# Patient Record
Sex: Male | Born: 1990 | Race: Asian | Hispanic: No | Marital: Married | Smoking: Never smoker
Health system: Southern US, Community
[De-identification: ages and names within clinical notes are randomized; demographics above are authoritative.]

---

## 2015-10-23 ENCOUNTER — Encounter (HOSPITAL_COMMUNITY): Payer: Self-pay

## 2015-10-23 ENCOUNTER — Emergency Department (HOSPITAL_COMMUNITY): Payer: Medicaid Other

## 2015-10-23 ENCOUNTER — Emergency Department (HOSPITAL_COMMUNITY)
Admission: EM | Admit: 2015-10-23 | Discharge: 2015-10-23 | Disposition: A | Discharge status: Home / Self Care | Payer: Medicaid Other | Attending: Emergency Medicine | Admitting: Emergency Medicine

## 2015-10-23 DIAGNOSIS — M79605 Pain in left leg: Secondary | ICD-10-CM

## 2015-10-23 NOTE — ED Notes (Signed)
Pt came to [facility] requesting work note.  Ok to give per [employer] Energy[employer] PA

## 2015-10-23 NOTE — ED Notes (Signed)
Per Pt, Pt had an injury to the left leg a couple years ago. Pt reports injury pain has increased lately, and he is requesting an Xray for the leg.

## 2015-10-23 NOTE — ED Provider Notes (Signed)
CSN: [id number]     Arrival date & time 10/23/15  1412 History  By signing my name below, I, Ronney LionSuzanne Le, attest that this documentation has been prepared under the direction and in the presence of Newell RubbermaidJeffrey Aylani Spurlock, PA-C. Electronically Signed: Ronney LionSuzanne Le, ED Scribe. 10/23/2015. 3:43 PM.    Chief Complaint  Patient presents with  . Leg Pain   The history is provided by the patient. A language interpreter was used (tele-interpreter (language: Clydie BraunKaren)).   HPI Comments: Phone interpreter used Drue StagerMoe Myint Ezekiel Inaung is a 25 y.o. male who presents to the Emergency Department complaining of 7/10, aching left leg pain that began 1 year ago after stepping on a rock while playing an athletic sport. He also complains of an associated constant, tingling sensation that begins in his left heel and extends to his groin. Patient states he is able to walk, but doing so causes increased pain. Patient denies any back pain or weakness.  History reviewed. No pertinent past medical history. History reviewed. No pertinent past surgical history. No family history on file. Social History  Substance Use Topics  . Smoking status: Never Smoker   . Smokeless tobacco: None  . Alcohol Use: Yes     Comment: Occasional    Review of Systems  Musculoskeletal: Positive for myalgias (left leg pain). Negative for back pain.  Neurological: Negative for weakness.       Positive for left leg paresthesia.    Allergies  Review of patient's allergies indicates no known allergies.  Home Medications   Prior to Admission medications   Not on File   BP 118/70 mmHg  Pulse 68  Temp(Src) 97.3 F (36.3 C) (Oral)  Resp 16  SpO2 100%   Physical Exam  Constitutional: He is oriented to person, place, and time. He appears well-developed and well-nourished. No distress.  HENT:  Head: Normocephalic and atraumatic.  Eyes: Conjunctivae and EOM are normal.  Neck: Neck supple. No tracheal deviation present.  Cardiovascular: Normal rate.    Pulmonary/Chest: Effort normal. No respiratory distress.  Musculoskeletal: Normal range of motion.  Patient has no point tenderness to palpation, sensation intact to bilateral lower extremities, patellar reflexes 2+  Patient has no CT or L-spine tenderness to palpation full active range of motion of the knee hip and ankle.  Neurological: He is alert and oriented to person, place, and time.  Skin: Skin is warm and dry.  Psychiatric: He has a normal mood and affect. His behavior is normal.  Nursing note and vitals reviewed.   ED Course  Procedures (including critical care time)  DIAGNOSTIC STUDIES: Oxygen Saturation is 99% on RA, normal by my interpretation.    COORDINATION OF CARE: 3:31 PM - Discussed treatment plan with pt at bedside which includes left ankle and left lower leg x-ray. Pt verbalized understanding and agreed to plan.   Imaging Review Dg Tibia/fibula Left  10/23/2015  CLINICAL DATA:  Left fibular pain for 3 weeks. EXAM: LEFT TIBIA AND FIBULA - 2 VIEW COMPARISON:  None. FINDINGS: There is no evidence of fracture or other focal bone lesions. Soft tissues are unremarkable. IMPRESSION: Negative. Electronically Signed   By: Marnee SpringJonathon  Watts M.D.   On: 10/23/2015 16:25   Dg Ankle Complete Left  10/23/2015  CLINICAL DATA:  Pain for 3 weeks.  Injury 1 year prior. EXAM: LEFT ANKLE COMPLETE - 3+ VIEW COMPARISON:  None. FINDINGS: Frontal, oblique, and lateral views were obtained. There is no demonstrable fracture or joint effusion. Ankle mortise appears intact.  No appreciable arthropathic change. No erosion. IMPRESSION: No fracture or appreciable arthropathy. Ankle mortise appears intact. Electronically Signed   By: Bretta BangWilliam  Woodruff III M.D.   On: 10/23/2015 16:30   I have personally reviewed and evaluated these images as part of my medical decision-making.  MDM   Final diagnoses:  Pain of left lower extremity   Labs:  Imaging: DG Ankle Left and DG Tibia/Fibula  Left  Consults:  Therapeutics:  Discharge Meds:   Assessment/Plan:  Patient presents with reported tingling and pain in his left lower extremity. This is been present for approximately year. Patient is a very difficult exam as he does not speak [place], interpreter used, but still somewhat of a barrier between interpretation. Uncertain if this is patient difficulty explaining. Patient's symptoms started after an injury, there are diffuse to the left lower extremity, he has no loss of strength, reflexes intact. Patient has no lower back pain, point tenderness on exam. Patient will be referred to orthopedics for further evaluation and management. I have very low suspicion for acute neurological deficit that would require immediate evaluation here in the ED.  I personally performed the services described in this documentation, which was scribed in my presence. The recorded information has been reviewed and is accurate.    Eyvonne MechanicJeffrey Willine Schwalbe, PA-C 10/23/15 1706  Doug SouSam Jacubowitz, MD 10/23/15 [phone]

## 2015-10-23 NOTE — ED Notes (Signed)
Pt's family member reports family does not know how to see a doctor without coming into the ED.  This RN discussed with pt and family the role of a PCP and how referrals work.  This RN made referral to case management and provided pt [facility] and also [facility].  Pt and family member verbalized understanding.

## 2015-10-23 NOTE — ED Notes (Signed)
[place] is from [place], here for 6 months. C/o left lower leg pain since falling when he was "a kid". Info gathered through [place] speaking family member.

## 2015-10-23 NOTE — Discharge Instructions (Signed)
Please follow-up with Dr. Eulah PontMurphy for further evaluation and management of your ongoing complaints. If new or worsening signs or symptoms present please return medially for further evaluation Musculoskeletal Pain Musculoskeletal pain is muscle and boney aches and pains. These pains can occur in any part of the body. Your caregiver may treat you without knowing the cause of the pain. They may treat you if blood or urine tests, X-rays, and other tests were normal.  CAUSES There is often not a definite cause or reason for these pains. These pains may be caused by a type of germ (virus). The discomfort may also come from overuse. Overuse includes working out too hard when your body is not fit. Boney aches also come from weather changes. Bone is sensitive to atmospheric pressure changes. HOME CARE INSTRUCTIONS   Ask when your test results will be ready. Make sure you get your test results.  Only take over-the-counter or prescription medicines for pain, discomfort, or fever as directed by your caregiver. If you were given medications for your condition, do not drive, operate machinery or power tools, or sign legal documents for 24 hours. Do not drink alcohol. Do not take sleeping pills or other medications that may interfere with treatment.  Continue all activities unless the activities cause more pain. When the pain lessens, slowly resume normal activities. Gradually increase the intensity and duration of the activities or exercise.  During periods of severe pain, bed rest may be helpful. Lay or sit in any position that is comfortable.  Putting ice on the injured area.  Put ice in a bag.  Place a towel between your skin and the bag.  Leave the ice on for 15 to 20 minutes, 3 to 4 times a day.  Follow up with your caregiver for continued problems and no reason can be found for the pain. If the pain becomes worse or does not go away, it may be necessary to repeat tests or do additional testing. Your  caregiver may need to look further for a possible cause. SEEK IMMEDIATE MEDICAL CARE IF:  You have pain that is getting worse and is not relieved by medications.  You develop chest pain that is associated with shortness or breath, sweating, feeling sick to your stomach (nauseous), or throw up (vomit).  Your pain becomes localized to the abdomen.  You develop any new symptoms that seem different or that concern you. MAKE SURE YOU:   Understand these instructions.  Will watch your condition.  Will get help right away if you are not doing well or get worse.   This information is not intended to replace advice given to you by your health care provider. Make sure you discuss any questions you have with your health care provider.   Document Released: 04/14/2005 Document Revised: 07/07/2011 Document Reviewed: 12/17/2012 Elsevier Interactive Patient Education [employer] Inc.

## 2017-12-16 IMAGING — DX DG TIBIA/FIBULA 2V*L*
4 series · 4 of 4 positions shown · non-contrast
Comparison: None.

CLINICAL DATA: Left fibular pain for 3 weeks.

EXAM:
LEFT TIBIA AND FIBULA - 2 VIEW

[tibia ap (1 of 2)]
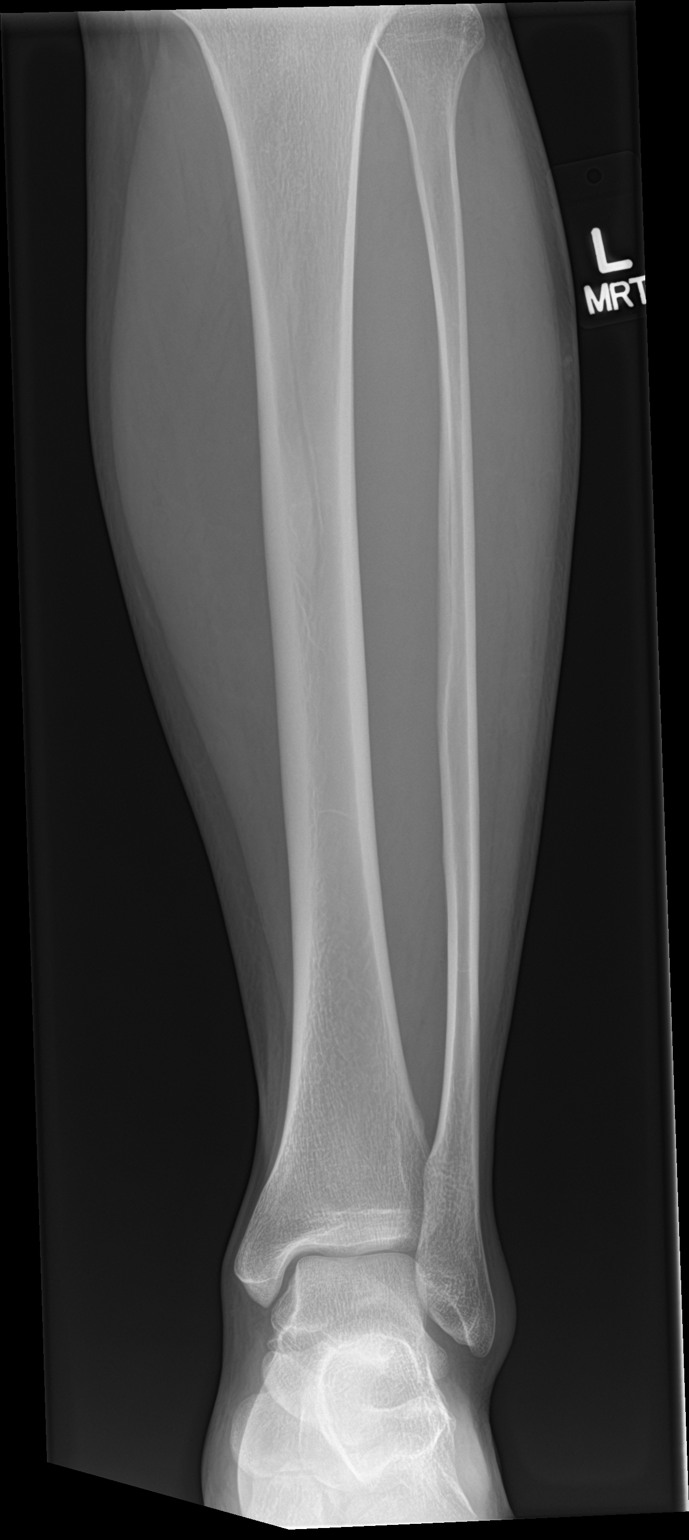

[tibia ap (2 of 2)]
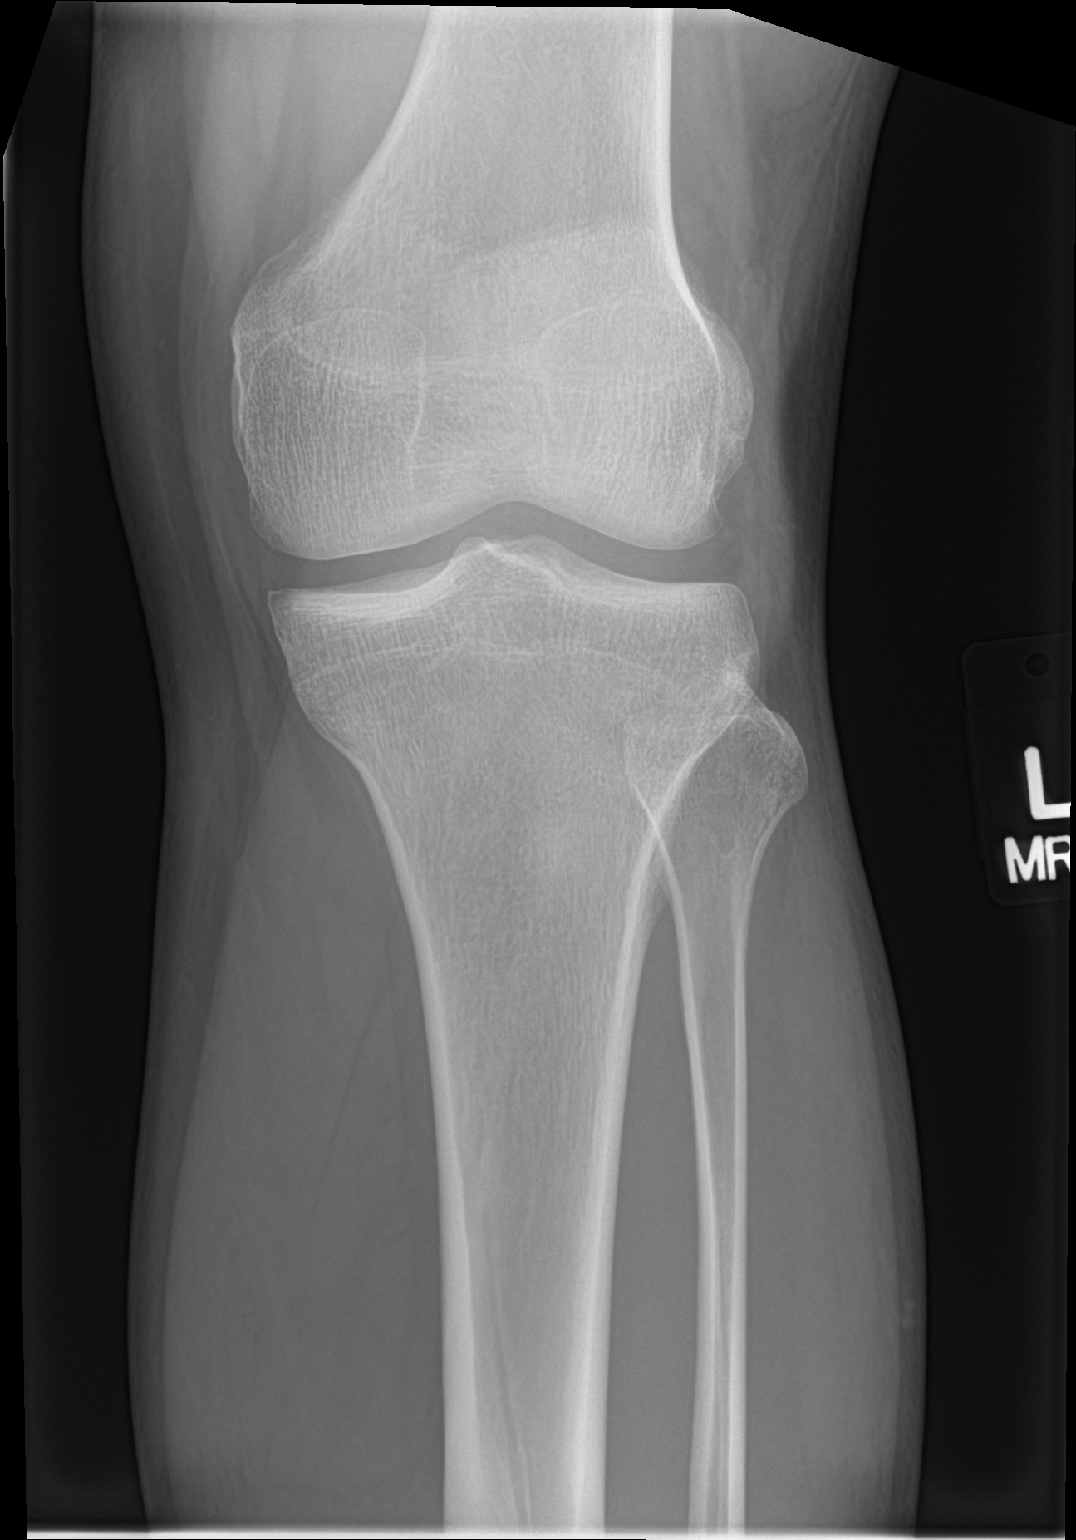

[tibia lat (1 of 2)]
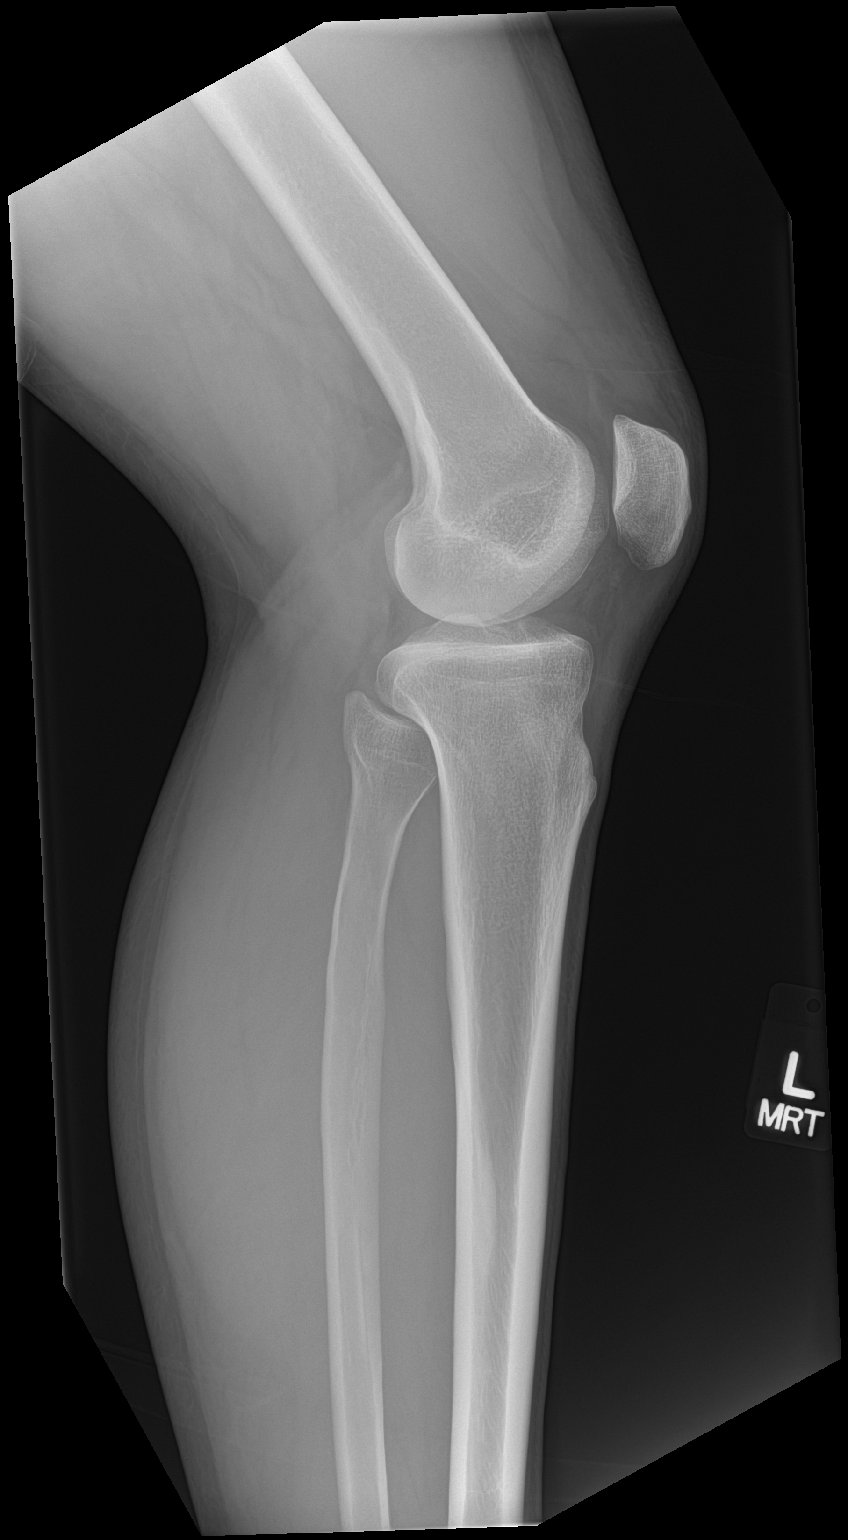

[tibia lat (2 of 2)]
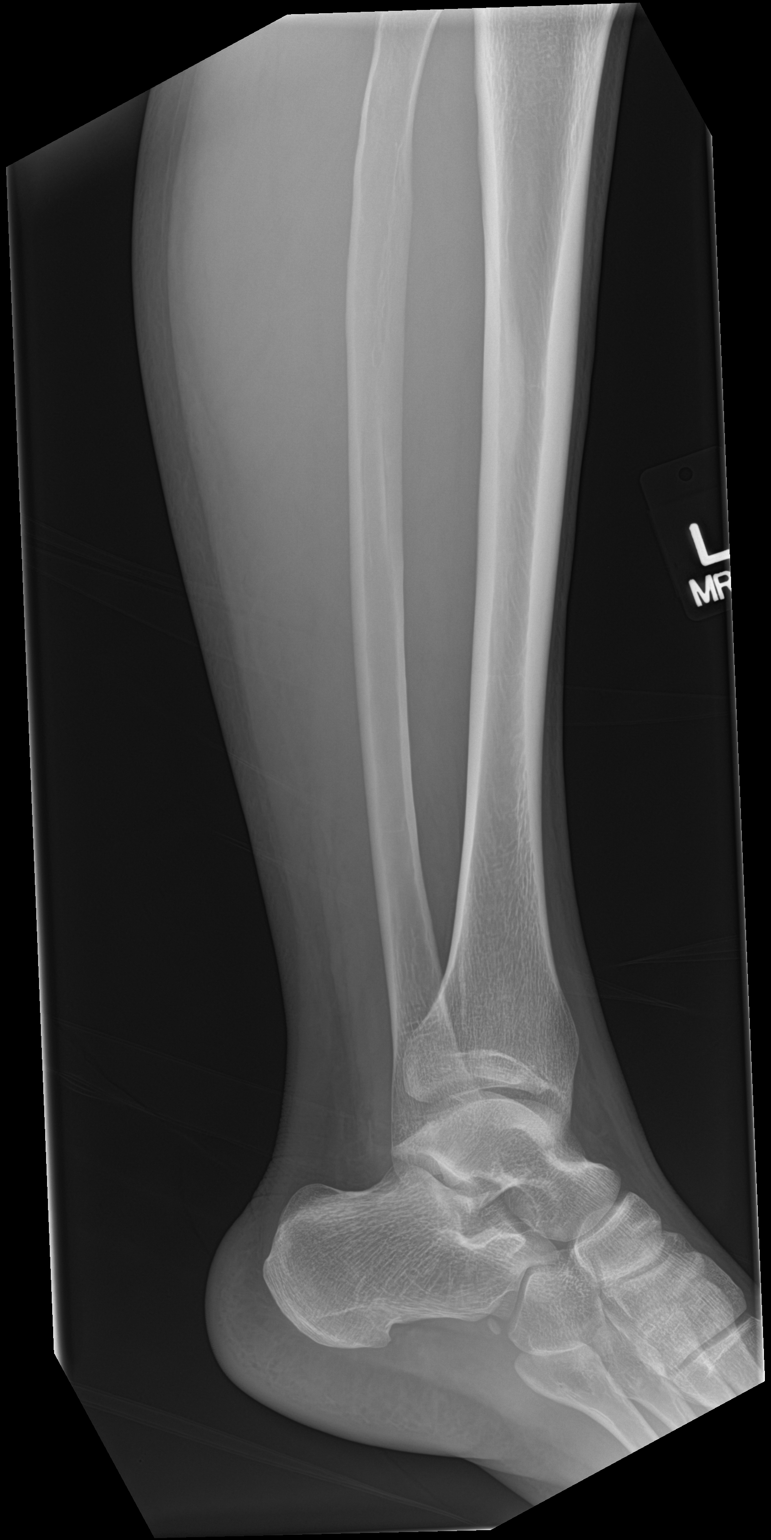

[4 of 4 positions shown; findings below may reference images not displayed]

FINDINGS: There is no evidence of fracture or other focal bone lesions. Soft
tissues are unremarkable.
IMPRESSION: Negative.

## 2019-07-25 ENCOUNTER — Ambulatory Visit: Payer: Medicaid Other | Attending: Internal Medicine

## 2019-07-25 DIAGNOSIS — Z23 Encounter for immunization: Secondary | ICD-10-CM

## 2019-07-25 NOTE — Progress Notes (Signed)
   Covid-19 Vaccination Clinic  Name:  Ryan Moyer    MRN: [id number] DOB: 26-Nov-1990  07/25/2019  [name] was observed post Covid-19 immunization for 15 minutes without incident. He was provided with Vaccine Information Sheet and instruction to access the V-Safe system.   [name] was instructed to call 911 with any severe reactions post vaccine: Marland Kitchen Difficulty breathing  . Swelling of face and throat  . A fast heartbeat  . A bad rash all over body  . Dizziness and weakness   Immunizations Administered    Name Date Dose VIS Date Route   Pfizer COVID-19 Vaccine 07/25/2019  9:49 AM 0.3 mL 04/08/2019 Intramuscular   Manufacturer: [employer], [employer]   Lot: [id number]   NDC: [address]-0

## 2019-08-16 ENCOUNTER — Ambulatory Visit: Payer: Medicaid Other | Attending: Internal Medicine

## 2019-08-16 DIAGNOSIS — Z23 Encounter for immunization: Secondary | ICD-10-CM

## 2019-08-16 NOTE — Progress Notes (Signed)
   Covid-19 Vaccination Clinic  Name:  Pharoah Goggins    MRN: [id number] DOB: 11-02-1990  08/16/2019  Mr. Kirker was observed post Covid-19 immunization for 15 minutes without incident. He was provided with Vaccine Information Sheet and instruction to access the V-Safe system.   Mr. Gehl was instructed to call 911 with any severe reactions post vaccine: Marland Kitchen Difficulty breathing  . Swelling of face and throat  . A fast heartbeat  . A bad rash all over body  . Dizziness and weakness   Immunizations Administered    Name Date Dose VIS Date Route   Pfizer COVID-19 Vaccine 08/16/2019 12:07 PM 0.3 mL 06/22/2018 Intramuscular   Manufacturer: [employer], [employer]   Lot: [id number]   NDC: [address]-7

## 2022-10-10 ENCOUNTER — Other Ambulatory Visit: Payer: Self-pay | Admitting: Internal Medicine

## 2022-10-21 LAB — EXTRA LAV TOP TUBE

## 2022-10-21 LAB — FOLATE: Folate: 8.4 ng/mL

## 2022-10-21 LAB — RPR (MONITOR) W/REFL: RPR (Monitor) w/refl Titer: NONREACTIVE

## 2022-10-21 LAB — VITAMIN B1, WHOLE BLOOD

## 2022-10-21 LAB — VITAMIN B12: Vitamin B-12: 639 pg/mL (ref 200–1100)
